# Patient Record
Sex: Male | Born: 1935 | Race: White | Hispanic: No | Marital: Married | Smoking: Former smoker
Health system: Southern US, Community
[De-identification: ages and names within clinical notes are randomized; demographics above are authoritative.]

## PROBLEM LIST (undated history)

## (undated) DIAGNOSIS — E785 Hyperlipidemia, unspecified: Secondary | ICD-10-CM

## (undated) DIAGNOSIS — R51 Headache: Secondary | ICD-10-CM

## (undated) DIAGNOSIS — E119 Type 2 diabetes mellitus without complications: Secondary | ICD-10-CM

## (undated) DIAGNOSIS — F0390 Unspecified dementia without behavioral disturbance: Secondary | ICD-10-CM

## (undated) DIAGNOSIS — F329 Major depressive disorder, single episode, unspecified: Secondary | ICD-10-CM

## (undated) DIAGNOSIS — I2510 Atherosclerotic heart disease of native coronary artery without angina pectoris: Secondary | ICD-10-CM

## (undated) DIAGNOSIS — R519 Headache, unspecified: Secondary | ICD-10-CM

## (undated) DIAGNOSIS — F32A Depression, unspecified: Secondary | ICD-10-CM

## (undated) HISTORY — DX: Headache: R51

## (undated) HISTORY — DX: Atherosclerotic heart disease of native coronary artery without angina pectoris: I25.10

## (undated) HISTORY — PX: CORONARY STENT PLACEMENT: SHX1402

## (undated) HISTORY — DX: Hyperlipidemia, unspecified: E78.5

## (undated) HISTORY — DX: Major depressive disorder, single episode, unspecified: F32.9

## (undated) HISTORY — DX: Unspecified dementia, unspecified severity, without behavioral disturbance, psychotic disturbance, mood disturbance, and anxiety: F03.90

## (undated) HISTORY — DX: Type 2 diabetes mellitus without complications: E11.9

## (undated) HISTORY — DX: Headache, unspecified: R51.9

## (undated) HISTORY — PX: INGUINAL HERNIA REPAIR: SHX194

## (undated) HISTORY — DX: Depression, unspecified: F32.A

## (undated) HISTORY — PX: OTHER SURGICAL HISTORY: SHX169

---

## 2004-09-23 ENCOUNTER — Other Ambulatory Visit: Payer: Self-pay

## 2004-09-23 ENCOUNTER — Inpatient Hospital Stay: Payer: Self-pay | Admitting: Cardiology

## 2004-09-28 ENCOUNTER — Inpatient Hospital Stay: Payer: Self-pay | Admitting: Unknown Physician Specialty

## 2008-02-03 ENCOUNTER — Ambulatory Visit: Payer: Self-pay | Admitting: Internal Medicine

## 2008-02-10 ENCOUNTER — Ambulatory Visit: Payer: Self-pay | Admitting: Internal Medicine

## 2008-02-17 ENCOUNTER — Ambulatory Visit: Payer: Self-pay | Admitting: Internal Medicine

## 2009-06-05 IMAGING — CT CT ABD-PELV W/ CM
1 of 2 series · 15 of 32 positions shown, 19 images · non-contrast
Comparison: none

REASON FOR EXAM: left lower abd and flank pain
COMMENTS:

PROCEDURE:     JUELZ - JUELZ ABDOMEN / PELVIS W  - February 03, 2008  [DATE]
RESULT:     Comparison: No available comparison exam.
TECHNIQUE: CT examination of the abdomen and pelvis was performed after
intravenous administration of 75 ml of Wsovue-T4J nonionic contrast in
addition to oral contrast. Collimation is 8 mm.

[Series 2: soft tissue · axial · 0.76mm/px · z∈[-452,-52]mm · 15 of 56 slices shown, 19 images]
[im 3/56  soft-tissue]
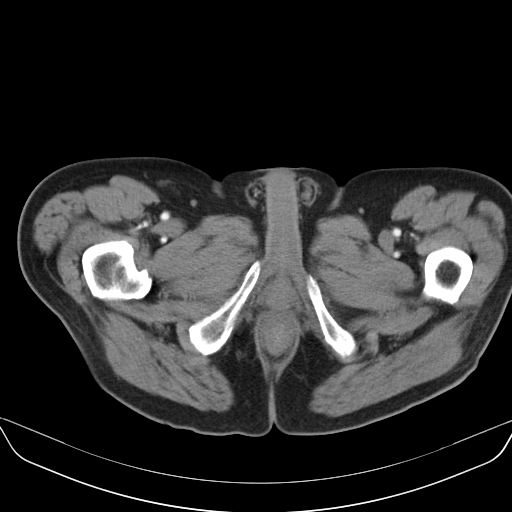
[im 3/56  bone]
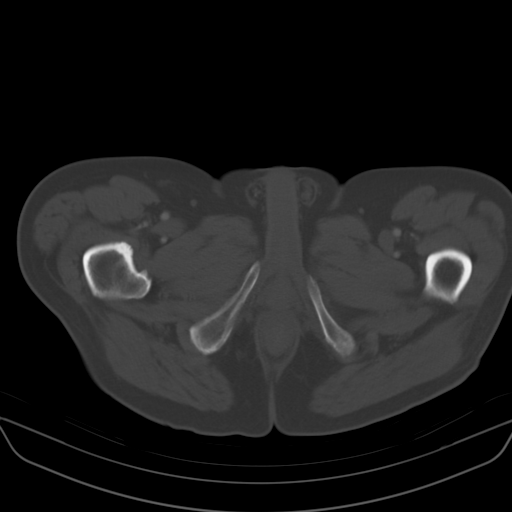
[im 8/56  soft-tissue]
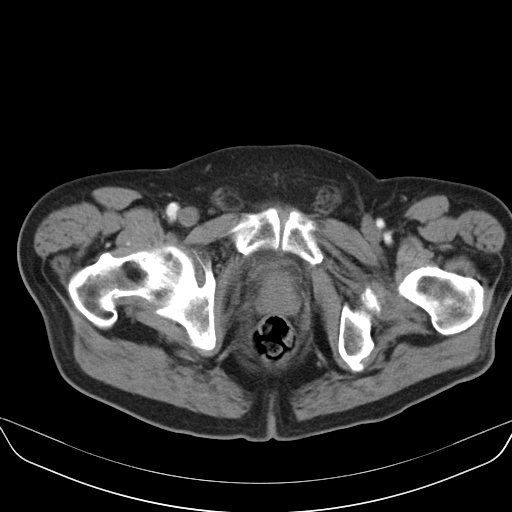
[im 13/56  soft-tissue]
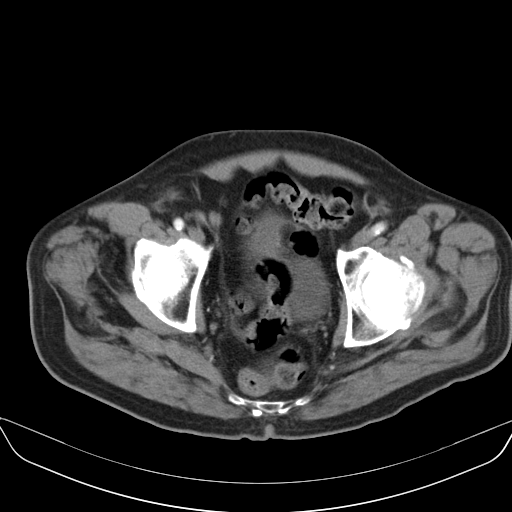
[im 16/56  soft-tissue]
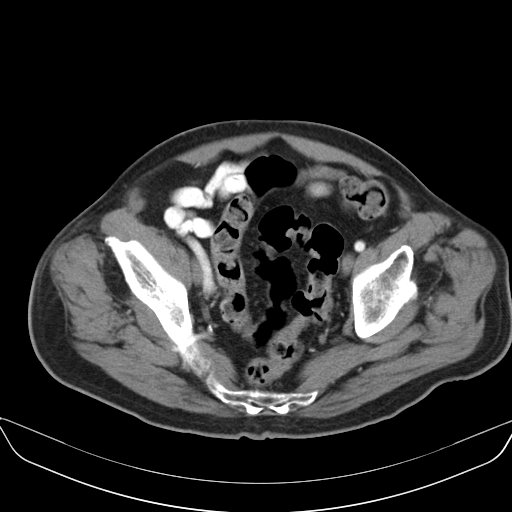
[im 21/56  soft-tissue]
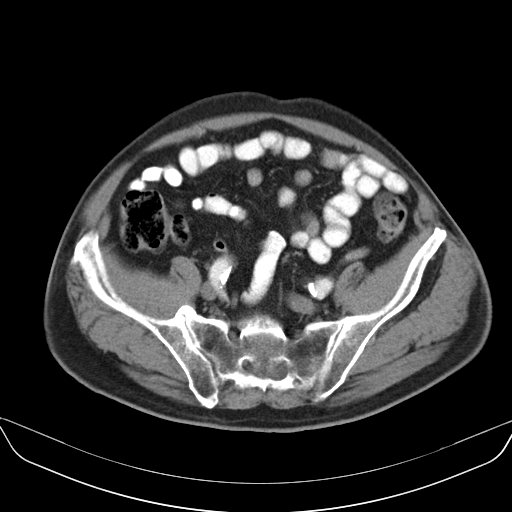
[im 23/56  soft-tissue]
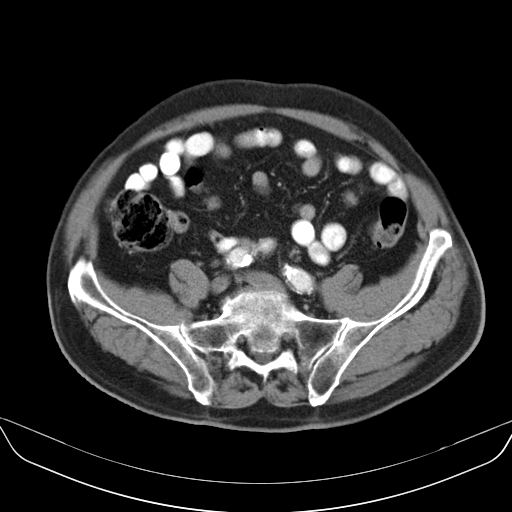
[im 28/56  soft-tissue]
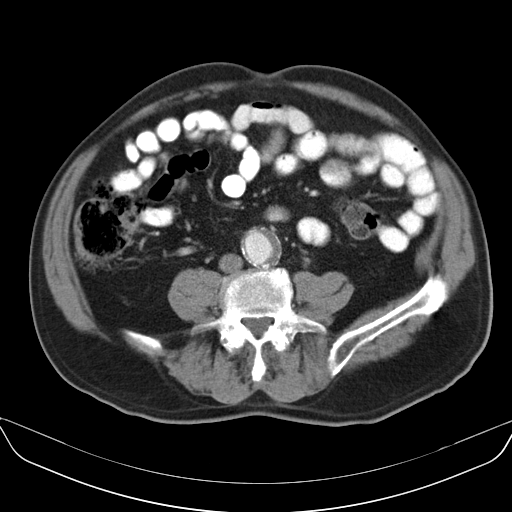
[im 33/56  soft-tissue]
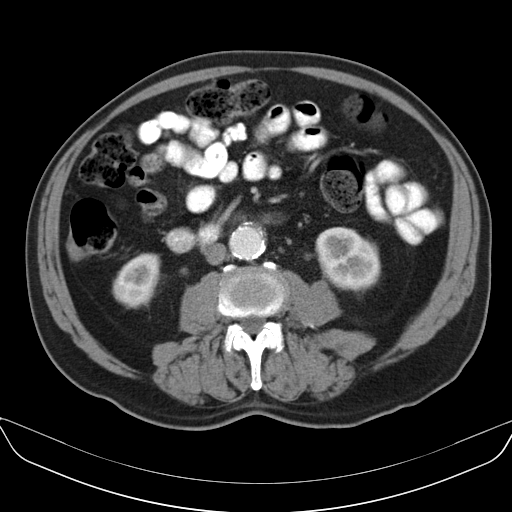
[im 36/56  soft-tissue]
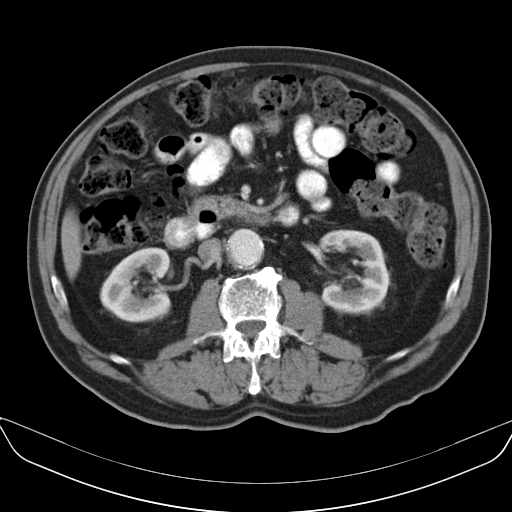
[im 36/56  bone]
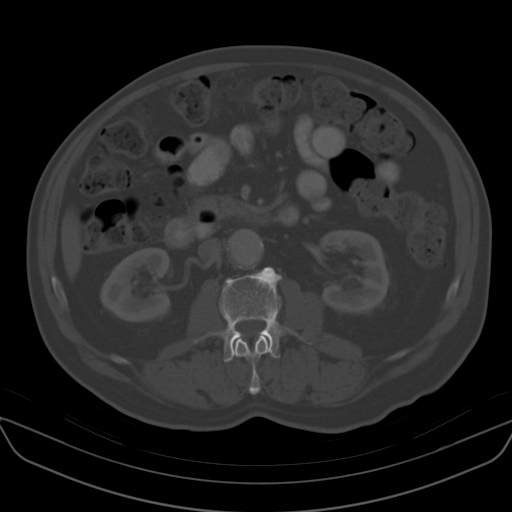
[im 41/56  soft-tissue]
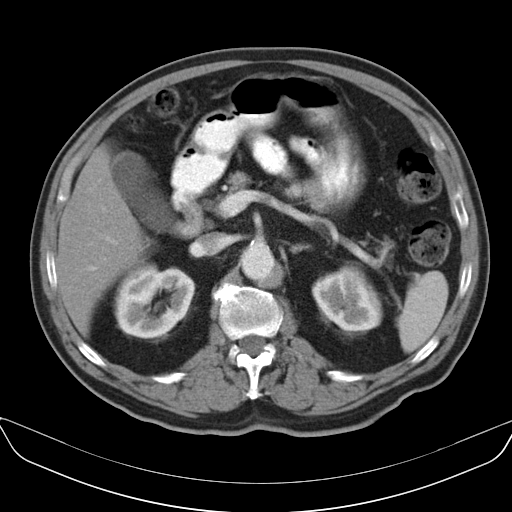
[im 43/56  soft-tissue]
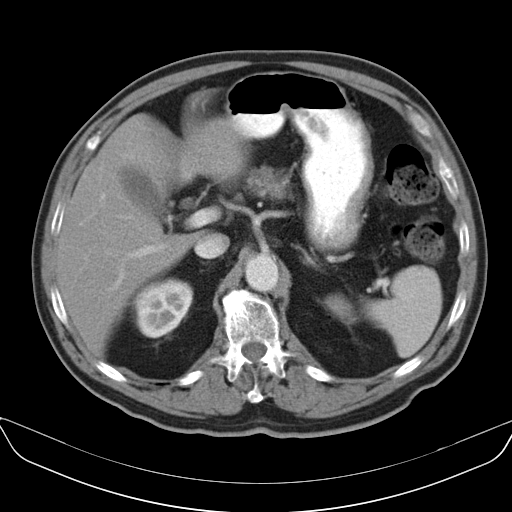
[im 46/56  lung]
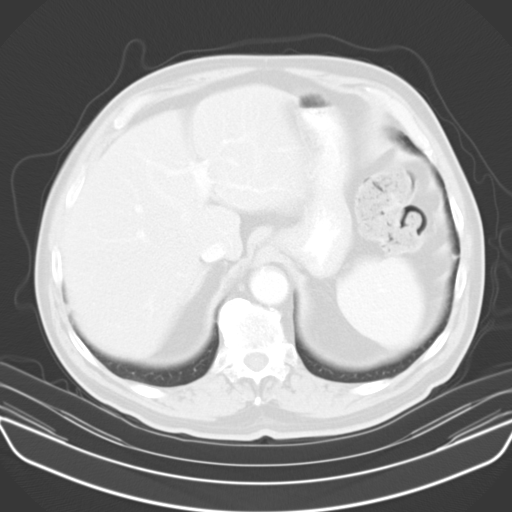
[im 48/56  soft-tissue]
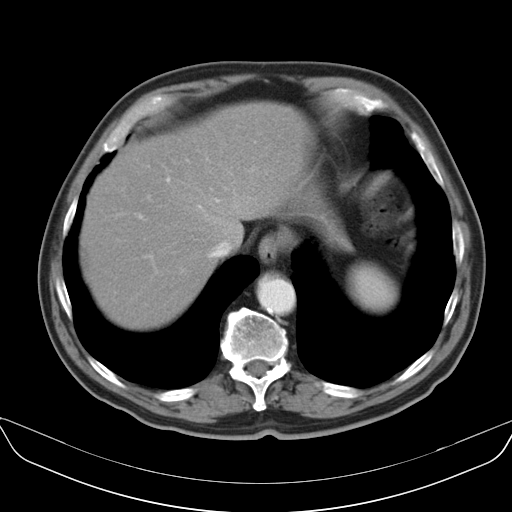
[im 48/56  lung]
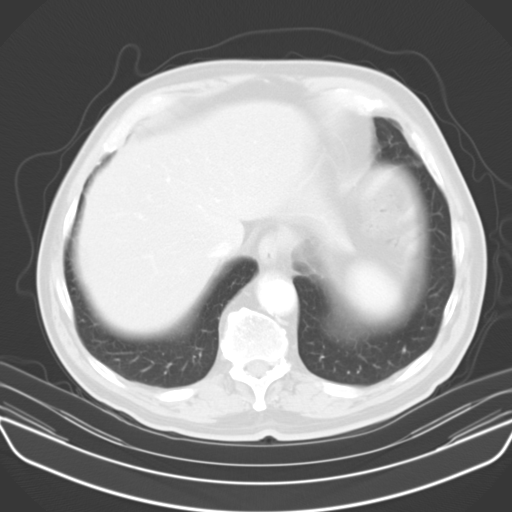
[im 51/56  lung]
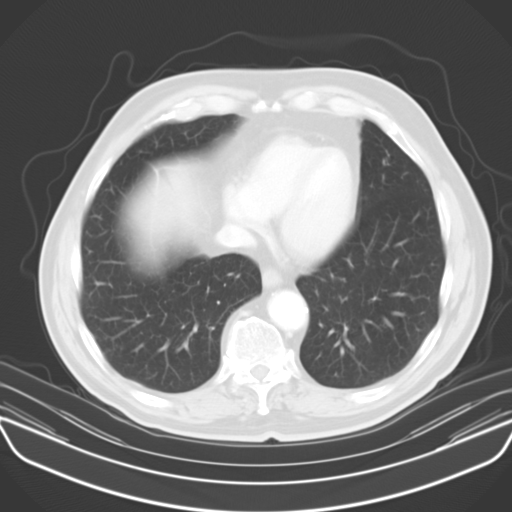
[im 53/56  soft-tissue]
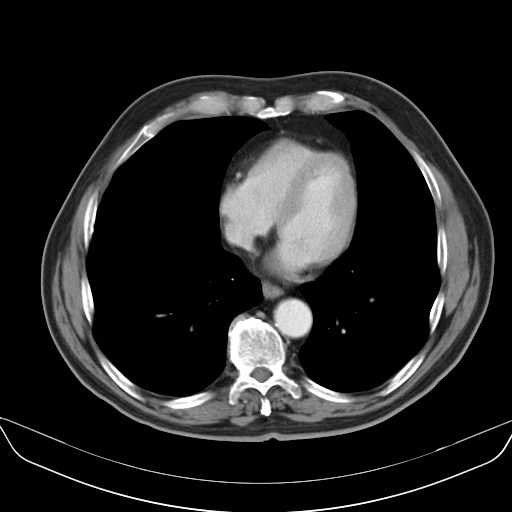
[im 53/56  lung]
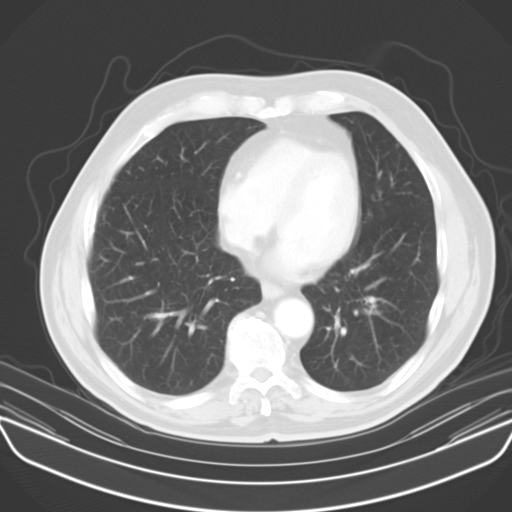

[15 of 32 positions shown; findings below may reference images not displayed]

FINDINGS: Several indeterminate small nodular opacities are seen involving the
bilateral lung bases. The largest in the left lower lobe measures 6 mm.

The liver, gallbladder, spleen, pancreas, adrenal glands and kidneys are
unremarkable. No renal or ureteral stone.

There is no dilatation or definite wall thickening of the bowels. The
appendix is not definitely seen. There is no significant intra
abdominal/pelvic fat stranding. There is no intraperitoneal free air. There
is no significant free fluid. There are no enlarged abdominal pelvic lymph
nodes.
IMPRESSION: 1. No abnormal finding to explain patient's left abdominal/flank pain.

2. Several indeterminate small nodular opacities are seen involving the
bilateral lung bases. The largest in the left lower lobe measures 6 mm.
Recommend follow up chest CT in 6 months.

## 2010-02-11 ENCOUNTER — Emergency Department: Payer: Self-pay | Admitting: Internal Medicine

## 2010-02-12 ENCOUNTER — Inpatient Hospital Stay: Payer: Self-pay | Admitting: Internal Medicine

## 2010-07-14 ENCOUNTER — Ambulatory Visit: Payer: Self-pay | Admitting: Internal Medicine

## 2010-07-16 ENCOUNTER — Inpatient Hospital Stay: Payer: Self-pay | Admitting: Internal Medicine

## 2010-10-10 ENCOUNTER — Ambulatory Visit: Payer: Self-pay | Admitting: Internal Medicine

## 2010-10-14 ENCOUNTER — Ambulatory Visit: Payer: Self-pay | Admitting: Internal Medicine

## 2011-01-09 ENCOUNTER — Ambulatory Visit: Payer: Self-pay | Admitting: Internal Medicine

## 2011-11-16 IMAGING — RF LUMBAR PUNCTURE FLUORO GUIDE
1 series · 1 of 1 positions shown · non-contrast
Comparison: none

REASON FOR EXAM: encephalopathy and fever
COMMENTS:

[Series 1: run · 1 of 1 slices shown]
[im 1/1]
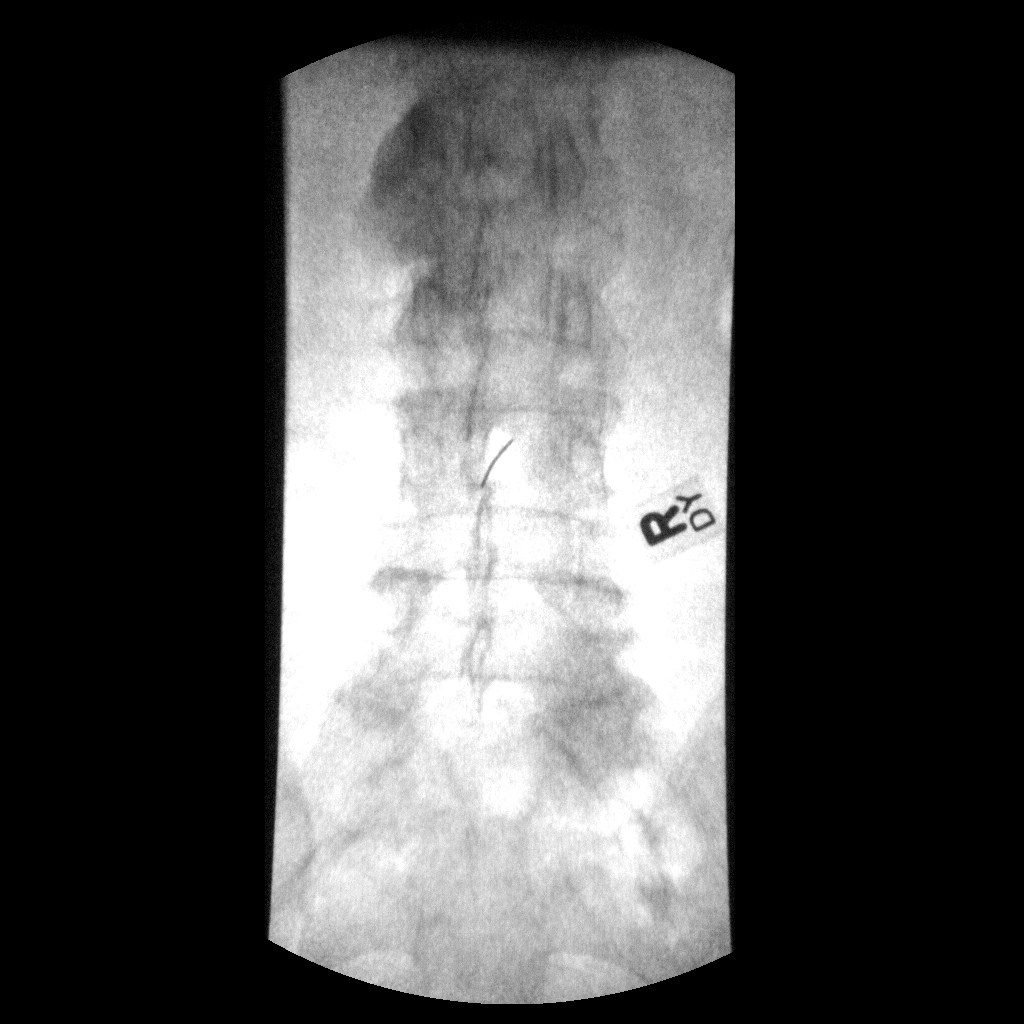

[1 of 1 positions shown; findings below may reference images not displayed]

PROCEDURE:     FL  - FL  GUIDED LUMBAR PUNCTURE  - July 16, 2010  [DATE]

RESULT:     Comparison: No prior studies for comparison.

Clinical Indication: Encephalopathy

Technique/findings: The procedure, risks, and benefits were discussed with
the patient and written informed consent was obtained. The patient was
placed in the prone position and prepped and draped in sterile fashion. With
the patient slightly oblique, under fluoroscopic guidance, the L3-L4
interspace was identified, to the right of midline. 1% lidocaine was used
for local anesthetic. Subsequently, a 22 gauge spinal needle was advanced
under fluoroscopic guidance to reach the thecal sac. Approximately 9 ml of
clear CSF was obtained, and divided into 4 tubes. The spinal needle was then
withdrawn, and pressure applied. Patient tolerated procedure well without
immediate complication.

Patient was sent to the EMG suite and instructed to lie supine for
approximately 2 hours. The nursing staff has been instructed to call should
there be any side effects, including severe post lumbar puncture headache.

CSF samples were sent to the laboratory for the requested laboratory studies.
IMPRESSION: Successful lumbar puncture under fluoroscopic guidance.

## 2011-11-16 IMAGING — CT CT HEAD WITHOUT CONTRAST
2 series · 15 of 30 positions shown, 19 images · non-contrast
Comparison: none

REASON FOR EXAM: AMS
COMMENTS:   May transport without cardiac monitor

PROCEDURE:     CT  - CT HEAD WITHOUT CONTRAST  - July 16, 2010 [DATE]
RESULT:     Head CT dated 07/16/2010 comparison made to prior study dated
02/12/2010.
TECHNIQUE: Helical noncontrasted 5 mm sections were obtained from the skull
base through the vertex.

[Series 2: without · axial · non-contrast · 0.47mm/px · z∈[+296,+432]mm · 13 of 33 slices shown, 17 images]
[im 3/33  brain]
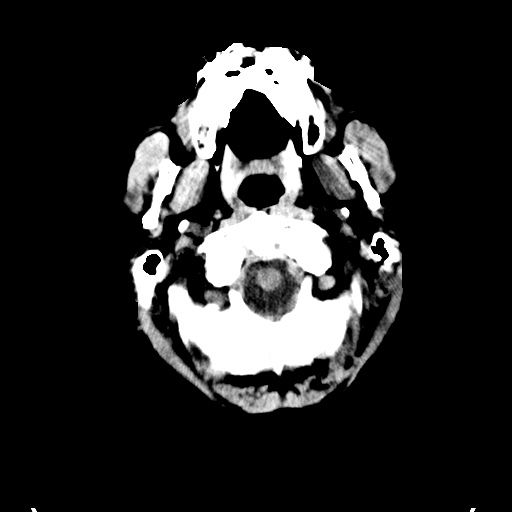
[im 3/33  bone]
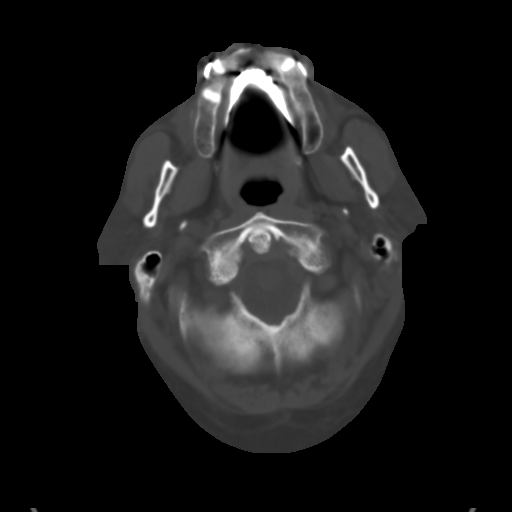
[im 5/33  brain]
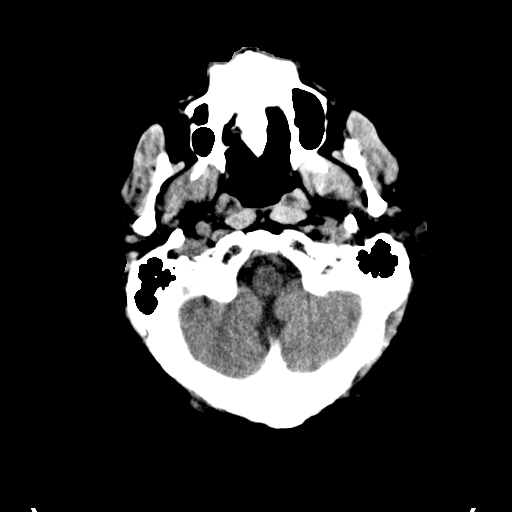
[im 7/33  brain]
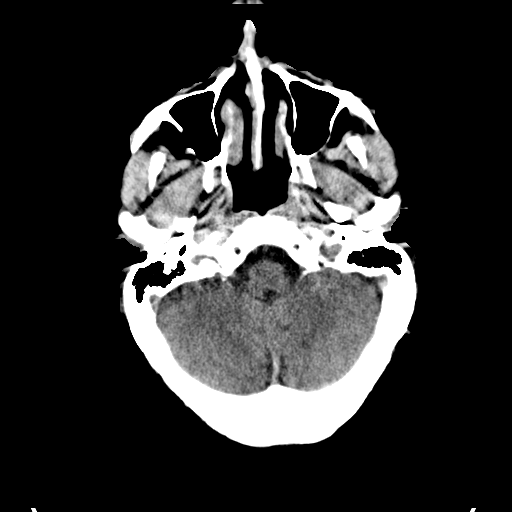
[im 10/33  brain]
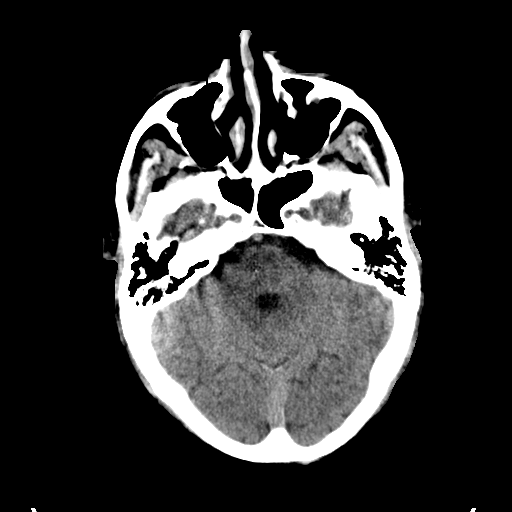
[im 12/33  brain]
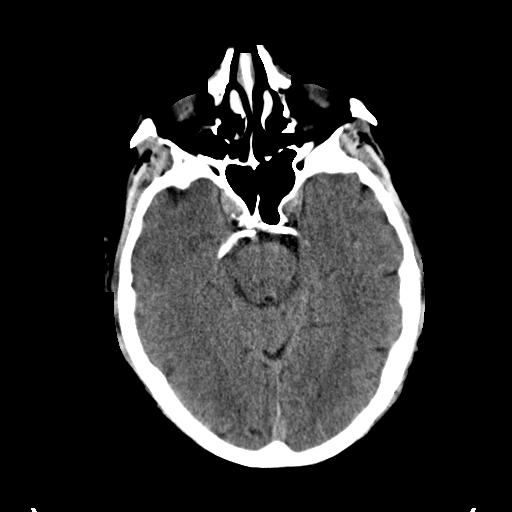
[im 12/33  bone]
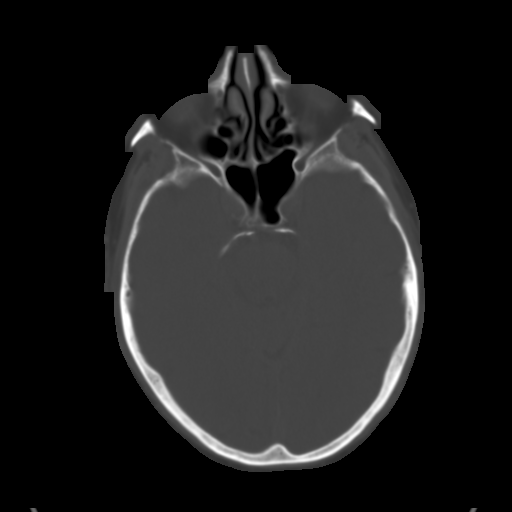
[im 14/33  brain]
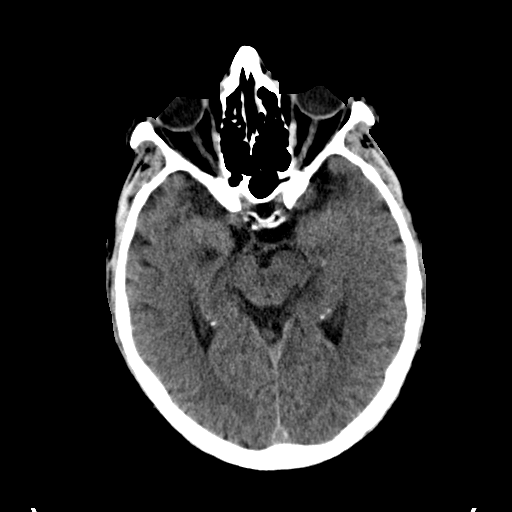
[im 17/33  brain]
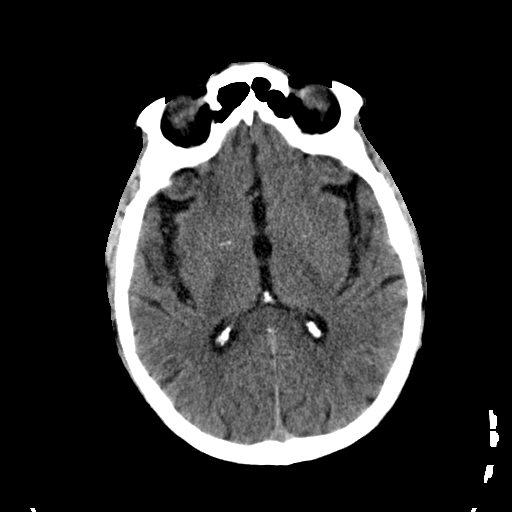
[im 19/33  brain]
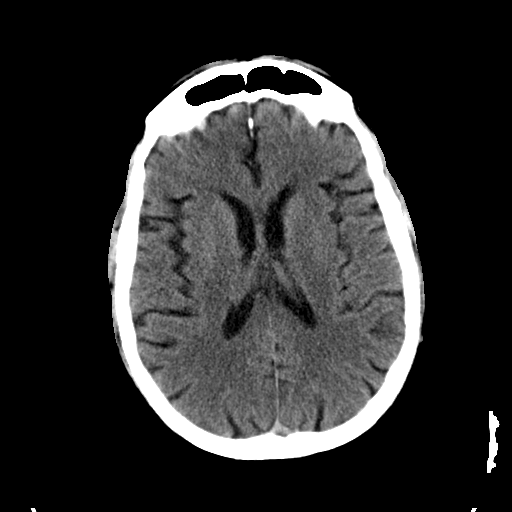
[im 21/33  brain]
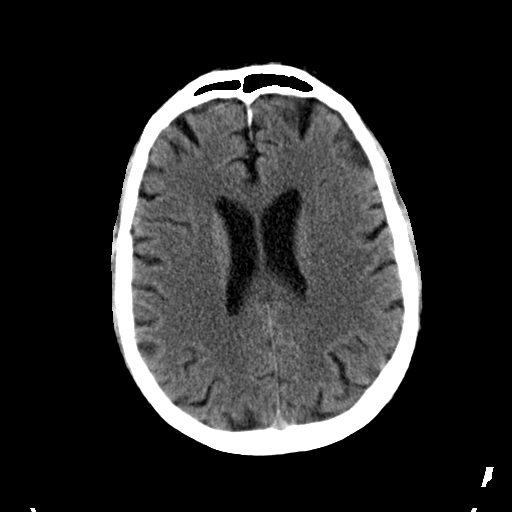
[im 21/33  bone]
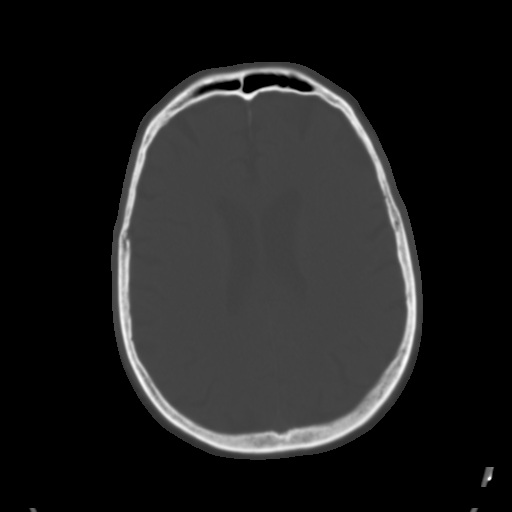
[im 23/33  brain]
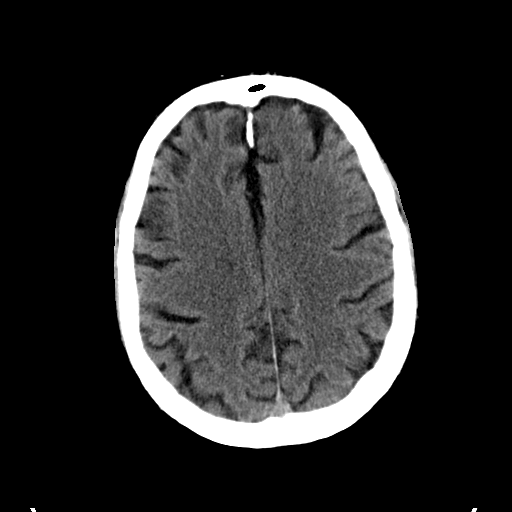
[im 26/33  brain]
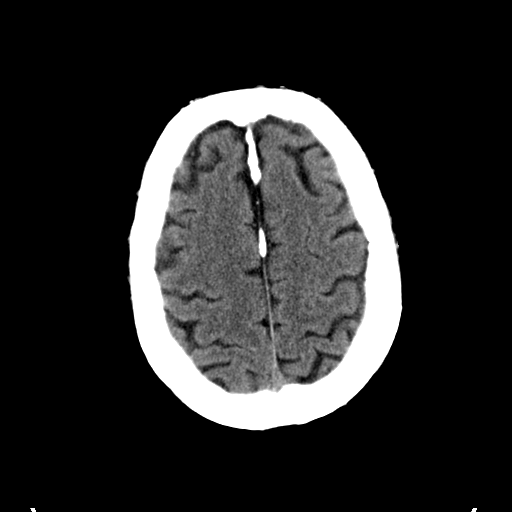
[im 28/33  brain]
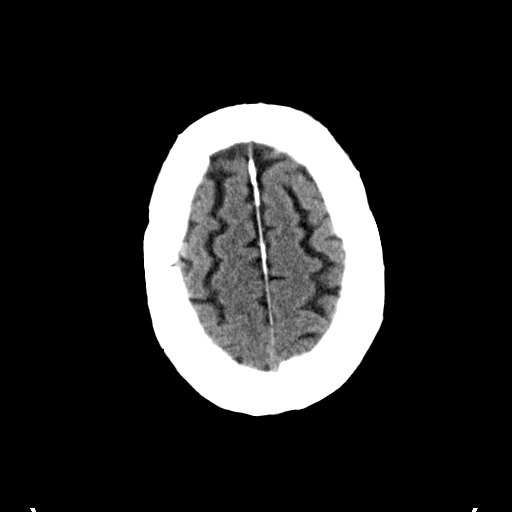
[im 30/33  brain]
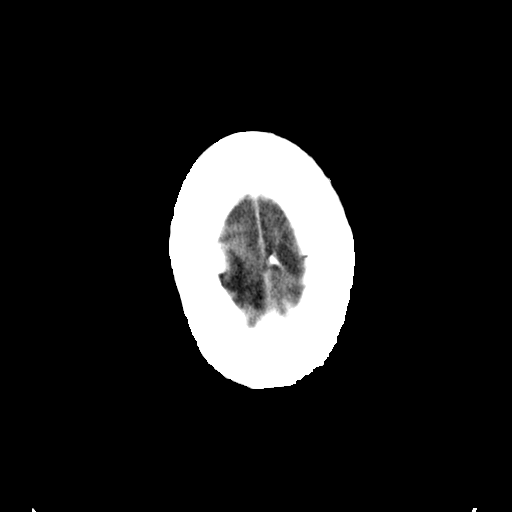
[im 30/33  bone]
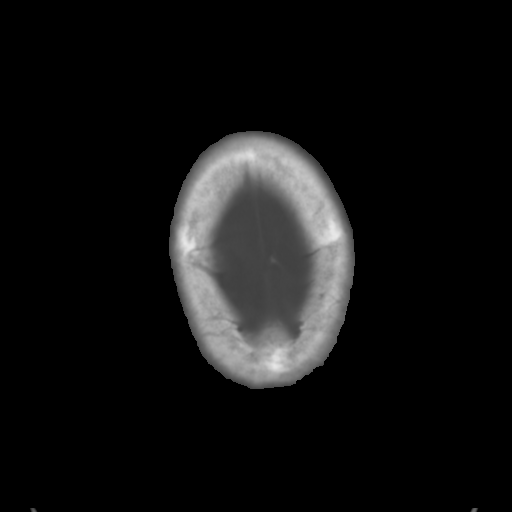

[Series 3: bone · axial · 0.47mm/px · z∈[+296,+316]mm · 2 of 33 slices shown]
[im 3/33  bone]
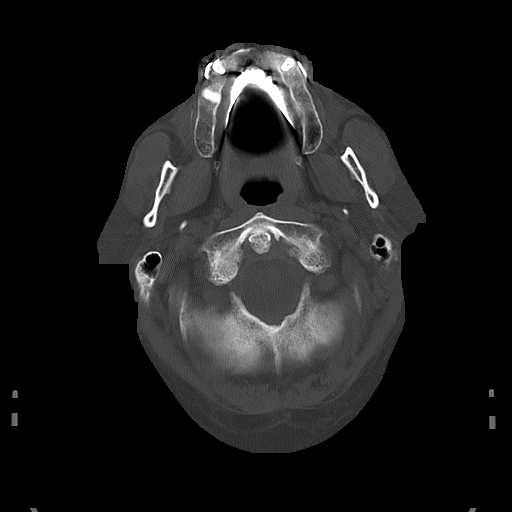
[im 7/33  bone]
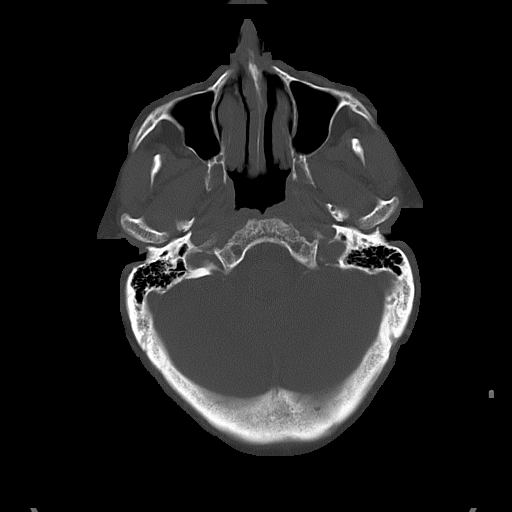

[15 of 30 positions shown; findings below may reference images not displayed]

FINDINGS: There is mild to moderate diffuse cortical atrophy. Mild areas of
low attenuation project within the subcortical, deep, and periventricular
right matter regions. There is no evidence of intra-axial nor extra-axial
fluid collections nor mass effect or acute hemorrhage. Evaluation of the
pons demonstrates an area of low-attenuation within the base of the on the
right. This may represent the sequela of prior infarction. The cerebellum is
grossly unremarkable. Basal ganglial calcifications are identified. There is
no evidence of a depressed cranial fracture.
IMPRESSION: Mild to moderate involutional changes as described above
2. Findings suspicious for a region of prior lacunar infarction involving
the pons
3. No evidence of acute abnormalities.

## 2013-06-21 ENCOUNTER — Encounter: Payer: Self-pay | Admitting: Neurology

## 2013-06-21 ENCOUNTER — Ambulatory Visit (INDEPENDENT_AMBULATORY_CARE_PROVIDER_SITE_OTHER): Payer: Medicare Other | Admitting: Neurology

## 2013-06-21 VITALS — BP 112/60 | HR 80 | Temp 98.0°F | Resp 16 | Wt 167.7 lb

## 2013-06-21 DIAGNOSIS — F329 Major depressive disorder, single episode, unspecified: Secondary | ICD-10-CM

## 2013-06-21 DIAGNOSIS — G219 Secondary parkinsonism, unspecified: Secondary | ICD-10-CM

## 2013-06-21 DIAGNOSIS — F0390 Unspecified dementia without behavioral disturbance: Secondary | ICD-10-CM

## 2013-06-21 DIAGNOSIS — G212 Secondary parkinsonism due to other external agents: Secondary | ICD-10-CM

## 2013-06-21 NOTE — Progress Notes (Signed)
Andres Fletcher was seen today in the movement disorders clinic for neurologic consultation at the request of Dr. Malvin Johns.  His psychiatrist is Dr. Lynann Beaver and his PCP is Elmo Putt, MD.  The consultation is for the evaluation of tremor and possible Parkinsons disease.  I reviewed Dr. Margaretmary Eddy and Dr. Daisy Blossom notes.  The patient is accompanied by his wife and daughter, both of whom help supplement the history.  The first symptom(s) the patient noticed was bilateral UEand shuffling and this was 10-12 years ago.  He was first dx with PD and later the dx was retracted as they thought that it was secondary to abilify.  He was then "rediagnosed" with PD 5 years ago, and was put on levodopa.  He is currently on abilify and has been on that medication for years but his physicians felt that this was not cause of his sx's as the sx's continued to progress despite the fact that he has not changed abilify dose.  Neither he nor his wife or daughter think that the levodopa has necessarily help.  Specific Symptoms:  Tremor: yes Voice: no changes Sleep: intermittent trouble with sleeping - wakes early AM and will just pace and walk  Vivid Dreams:  yes  Acting out dreams:  yes, talks and intermittently snores Wet Pillows: yes Postural symptoms:  yes  Falls?  yes (2-3 falls in last few months, no hx of fx, no ambulatory assistive device) Bradykinesia symptoms: shuffling gait, slow movements, slowed thought processes, difficulty getting out of a chair and difficulty regaining balance Loss of smell:  no Loss of taste:  no Urinary Incontinence:  yes (does not need to wear depends but should per wife) Difficulty Swallowing:  yes (occasionally per wife) Handwriting, micrographia: yes Trouble with ADL's:  yes  Trouble buttoning clothing: yes Depression:  yes but more angry than in the past; talked with psychiatry about this.  Has been hospitalized for this in the past, with ECT. Memory changes:  yes, has been told  not to drive but still does. Hallucinations:  no  visual distortions: no N/V:  no Lightheaded:  no  Syncope: no Diplopia:  no Dyskinesia:  no    PREVIOUS MEDICATIONS: Sinemet and Sinemet CR  ALLERGIES:   Allergies  Allergen Reactions  . Penicillins     CURRENT MEDICATIONS:  No current outpatient prescriptions on file prior to visit.   No current facility-administered medications on file prior to visit.    PAST MEDICAL HISTORY:   Past Medical History  Diagnosis Date  . Diabetes   . Hyperlipidemia   . Head ache   . Coronary artery disease   . Dementia   . Depression     PAST SURGICAL HISTORY:   Past Surgical History  Procedure Laterality Date  . Inguinal hernia repair Right   . Coronary stent placement    . Appendectomy      SOCIAL HISTORY:   History   Social History  . Marital Status: Married    Spouse Name: N/A    Number of Children: N/A  . Years of Education: N/A   Occupational History  . supervisor      industries   Social History Main Topics  . Smoking status: Former Smoker -- 1.00 packs/day    Types: Cigarettes    Quit date: 06/21/1988  . Smokeless tobacco: Never Used  . Alcohol Use: No     Comment: hx of heavy EtOH use but quit 25 years ago  . Drug  Use: No  . Sexual Activity: Not on file   Other Topics Concern  . Not on file   Social History Narrative  . No narrative on file    FAMILY HISTORY:   Family Status  Relation Status Death Age  . Father Deceased 14    Cancer, colon  . Mother Deceased 12    unknown cause of death  . Sister Deceased     3, leukemia, AD, asthma  . Sister Alive     4, CAD  . Brother Alive     2, one with DM; CAD  . Child Alive     2, one with COPD    ROS:  A complete 10 system review of systems was obtained and was unremarkable apart from what is mentioned above.  PHYSICAL EXAMINATION:    VITALS:   Filed Vitals:   06/21/13 1242  BP: 112/60  Pulse: 80  Temp: 98 F (36.7 C)  Resp:  16  Height: 5\' 6"  (1.676 m)  Weight: 167 lb 11.2 oz (76.068 kg)    GEN:  The patient appears stated age and is in NAD. HEENT:  Normocephalic, atraumatic.  The mucous membranes are moist. The superficial temporal arteries are without ropiness or tenderness. CV:  RRR Lungs:  CTAB Neck/HEME:  There are no carotid bruits bilaterally.  Neurological examination:  Orientation: The patient and his family supply most of the history.  He is very apraxic with motor commands. Cranial nerves: There is good facial symmetry. Pupils are equal round and reactive to light bilaterally. Fundoscopic exam reveals clear margins bilaterally. Extraocular muscles are intact. The visual fields are full to confrontational testing. The speech is fluent and clear. Soft palate rises symmetrically and there is no tongue deviation. Hearing is intact to conversational tone. Sensation: Sensation is intact to light and pinprick throughout (facial, trunk, extremities). Vibration is intact at the bilateral big toe. There is no extinction with double simultaneous stimulation. There is no sensory dermatomal level identified. Motor: Strength is 5/5 in the bilateral upper and lower extremities.   Shoulder shrug is equal and symmetric.  There is no pronator drift. Deep tendon reflexes: Deep tendon reflexes are 2/4 at the bilateral biceps, triceps, brachioradialis, patella and absent at the bilateral achilles. Plantar responses are downgoing bilaterally.  Movement examination: Tone: There is mildly increased tone in the bilateral upper extremities, most notable with activation procedures.  The pt scored a 22/30 on his MMSE.   Abnormal movements: There is a near constant her right upper extremity resting tremor and a more intermittent left upper extremity resting tremor. Coordination:  There is very mild decremation with RAM's, seen most prominently with finger taps bilaterally.  Hand opening and closing and heel taps and toe taps were  fairly good. Gait and Station: The patient has minimal difficulty arising out of a deep-seated chair without the use of the hands. The patient's stride length is slightly decreased.  There is decreased arm swing and an increase in the right upper extremity tremor with ambulation.    ASSESSMENT/PLAN:  1.  Parkinsonism.  -I had a long discussion with the patient and family.  I talked to them about the fact that Abilify can produce secondary parkinsonism, and even though the dosage has not changed over the years, the symptoms of parkinsonism could continue to progress because of long-term dopamine receptor blocking.  I do not know if this is idiopathic Parkinson's disease because of the fact that he is on Abilify.  In  order to definitively he will either need to change the medication to something like Seroquel or Clozaril, or have a DaT scan.  Ultimately, he and his family decided that he would like to talk to his psychiatrist.  I wrote a letter to his psychiatrist and I gave a copy to the patient to take with him and give it to the psychiatrist, and I will also send a copy to Dr. Alycia Rossetti.  -If it is possible to change the Abilify to Seroquel or Clozaril, then it will still take at least 6 months to know whether or not this represents idiopathic Parkinson's disease or secondary parkinsonism.  Tardive parkinsonism is quite rare.  -I. talked with him about Myobloc.  He does not think that the drooling is bad enough to warrant this currently.  -I. talked to him about a modified barium swallow evaluation since he is noticing some difficulty swallowing (more noticed by his family than him).  He is going to talk about this with Dr. Malvin Johns more.  -The physical therapy program for PD could be of value, but the patient has refused this. 2.  Dementia  `-I had a long discussion with the patient and his family.  Greater than 50% of the 60 minute visit was spent in counseling.  We talked about safety.  We talked about the  fact he should not be driving.  I do not think that he should be mowing his lawn, which he currently is.  I talked to his family about their responsibility and their role in helping the patient understand and know the things that he can do versus those he cannot. 3.  He is going to followup with Dr. Malvin Johns.  I would be happy to see him in the future if needed.  He is going to followup with his psychiatrist, Dr. Alycia Rossetti as well as his recommendations will be integral to our dx and treatment.  Thank you, Dr. Malvin Johns, for allowing me to see this patient and his family.  Please feel free to contact me should there be any questions or concerns.  -I. agree with the recent addition of Aricept.

## 2013-06-21 NOTE — Patient Instructions (Addendum)
1.  I recommend that you don't drive.  I recommend that you don't work with dangerous equipment, including the lawn equipment  2.  You may follow up with myself or Dr. Malvin Johns.

## 2013-10-15 ENCOUNTER — Ambulatory Visit: Payer: Self-pay | Admitting: Internal Medicine

## 2014-02-01 ENCOUNTER — Observation Stay: Payer: Self-pay | Admitting: Internal Medicine

## 2014-02-01 LAB — CBC WITH DIFFERENTIAL/PLATELET
Basophil #: 0.1 10*3/uL (ref 0.0–0.1)
Basophil %: 0.9 %
EOS ABS: 0.4 10*3/uL (ref 0.0–0.7)
EOS PCT: 6.6 %
HCT: 32.3 % — AB (ref 40.0–52.0)
HGB: 10.5 g/dL — AB (ref 13.0–18.0)
LYMPHS ABS: 1.2 10*3/uL (ref 1.0–3.6)
Lymphocyte %: 21.9 %
MCH: 32.6 pg (ref 26.0–34.0)
MCHC: 32.5 g/dL (ref 32.0–36.0)
MCV: 100 fL (ref 80–100)
MONO ABS: 0.4 x10 3/mm (ref 0.2–1.0)
Monocyte %: 7.7 %
NEUTROS PCT: 62.9 %
Neutrophil #: 3.5 10*3/uL (ref 1.4–6.5)
Platelet: 128 10*3/uL — ABNORMAL LOW (ref 150–440)
RBC: 3.22 10*6/uL — AB (ref 4.40–5.90)
RDW: 15.7 % — AB (ref 11.5–14.5)
WBC: 5.6 10*3/uL (ref 3.8–10.6)

## 2014-02-01 LAB — COMPREHENSIVE METABOLIC PANEL
ALBUMIN: 3.6 g/dL (ref 3.4–5.0)
ALK PHOS: 86 U/L
Anion Gap: 4 — ABNORMAL LOW (ref 7–16)
BUN: 12 mg/dL (ref 7–18)
Bilirubin,Total: 0.5 mg/dL (ref 0.2–1.0)
CHLORIDE: 106 mmol/L (ref 98–107)
CREATININE: 0.76 mg/dL (ref 0.60–1.30)
Calcium, Total: 8.2 mg/dL — ABNORMAL LOW (ref 8.5–10.1)
Co2: 29 mmol/L (ref 21–32)
EGFR (Non-African Amer.): >60
Glucose: 146 mg/dL — ABNORMAL HIGH (ref 65–99)
OSMOLALITY: 280 (ref 275–301)
POTASSIUM: 3.5 mmol/L (ref 3.5–5.1)
SGOT(AST): 24 U/L (ref 15–37)
SGPT (ALT): 8 U/L — ABNORMAL LOW (ref 12–78)
Sodium: 139 mmol/L (ref 136–145)
Total Protein: 6.6 g/dL (ref 6.4–8.2)

## 2014-02-01 LAB — URINALYSIS, COMPLETE
BACTERIA: NONE SEEN
Bilirubin,UR: NEGATIVE
Blood: NEGATIVE
GLUCOSE, UR: NEGATIVE mg/dL (ref 0–75)
Leukocyte Esterase: NEGATIVE
Nitrite: NEGATIVE
PROTEIN: NEGATIVE
Ph: 5 (ref 4.5–8.0)
RBC,UR: <1 /HPF (ref 0–5)
SQUAMOUS EPITHELIAL: NONE SEEN
Specific Gravity: 1.042 (ref 1.003–1.030)

## 2014-02-01 LAB — CK TOTAL AND CKMB (NOT AT ARMC)
CK, TOTAL: 70 U/L
CK, TOTAL: 70 U/L
CK, TOTAL: 79 U/L
CK-MB: 1.7 ng/mL (ref 0.5–3.6)
CK-MB: 1.9 ng/mL (ref 0.5–3.6)
CK-MB: 2.1 ng/mL (ref 0.5–3.6)

## 2014-02-01 LAB — TROPONIN I
Troponin-I: <0.02 ng/mL
Troponin-I: <0.02 ng/mL
Troponin-I: <0.02 ng/mL

## 2014-02-01 LAB — LIPASE, BLOOD: LIPASE: 441 U/L — AB (ref 73–393)

## 2015-02-04 NOTE — H&P (Signed)
PATIENT NAME:  Andres Fletcher, GRANIERI MR#:  [id number] DATE OF BIRTH:  August 05, 1929  DATE OF ADMISSION:  02/01/2014  PRIMARY CARE PHYSICIAN: Dr. Yates Decamp  REFERRING MEDICAL DOCTOR: Dr.  Manson Passey.   CHIEF COMPLAINT: Chest pain.   HISTORY OF PRESENT ILLNESS: The patient is an 81 year old male with past medical history of coronary artery disease status post stent placement in the year 1990, hyperlipidemia, diabetes mellitus, chronic anemia, depression and hypertension is presenting to the ER with a chief complaint of chest pain. The patient lives alone and his caretaker takes care of him. The patient is a poor historian in view of Parkinson's disease and underlying dementia. Daughter and wife are not quite sure when the patient started having chest pain. The patient is a poor historian and keeps on changing his chief complaint. To the ER physician he has complained of chest pain with no shortness of breath. During my examination, the patient is complaining of epigastric abdominal discomfort. To a few ER staff the patient was complaining of nausea. By his reporting  the patient is a poor historian and we can not rely on his history. First set of cardiac enzymes are negative. EKG has revealed left bundle branch block. There are no previous EKGs to compare with. Hospitalist team is called to admit the patient. The patient sees Dr. Gwen Pounds, the cardiologist as an outpatient. CAT scan of the abdomen and pelvis was done as patient was complaining of abdominal pain, which has revealed stool in the colon. No other complaints.   PAST MEDICAL HISTORY: Coronary artery disease status post stent, diabetes mellitus, depression, hypertension, hyperlipidemia, chronic anemia, chronic depression, history of hypogonadism.  PAST SURGICAL HISTORY: Hernia repair, stent placement.   ALLERGIES: THE PATIENT IS ALLERGIC TO PENICILLIN AND STATINS.   PSYCHOSOCIAL HISTORY: Lives at home, lives alone, caretaker takes care of him. The patient  used to smoke, but quit smoking more than 10 years ago. Denies alcohol. No  history of illicit drugs.   FAMILY HISTORY: Father died from colon cancer. Mother deceased from congestive heart failure.   HOME MEDICATIONS: Prednisone 20 mg once daily, glipizide 5 mg once daily, Effexor 150 mg once a day, carbidopa levodopa 25/100, 1 tablet p.o. 3 times a day. Abilify 10 mg p.o. once daily.  REVIEW OF SYSTEMS: The patient being poor historian and demented, unable to get  review of systems.   PHYSICAL EXAMINATION: VITAL SIGNS: Temperature 98 degrees Fahrenheit, pulse 66, respirations 20, blood pressure 136/72,  pulse oximetry 98%.  GENERAL APPEARANCE: Not in acute distress. Moderately built and nourished.  HEENT: Normocephalic, atraumatic. Pupils are equally reacting to light and accommodation. No scleral icterus. No conjunctival injection. No sinus tenderness. No postnasal drip. Moist mucous membranes.  NECK: Supple. No JVD. No thyromegaly. Range of motion is intact.  LUNGS: Clear to auscultation bilaterally. No accessory muscle use and no anterior chest wall tenderness on palpation.  CARDIOVASCULAR: S1, S2 normal. Regular rate and rhythm. Positive murmur.  GASTROINTESTINAL: Soft. Bowel sounds are positive in all four quadrants, positive epigastric tenderness, but no rebound tenderness. No masses felt. No hepatosplenomegaly.  NEUROLOGIC:  The patient is oriented x 1 to 2, follows few verbal commands. Reflexes are 2+ spontaneously moving his extremities.  SKIN: Warm to touch. Normal turgor. No rashes. No lesions.  MUSCULOSKELETAL: No joint effusion, tenderness, erythema.  PSYCHIATRIC: Normal mood and affect.  EXTREMITIES: Peripheral pulses are 1+. No clubbing. No edema. No cyanosis.   LABORATORIES AND IMAGING STUDIES: Cardiac enzymes, CK total 70,  CPK-MB 1.7. Troponin less than 0.02. WBC 5.6, hemoglobin 10.5, hematocrit 32.3, platelets are 128. Urinalysis yellow in color, clear in appearance, blood  negative, nitrites and leukocyte esterase are negative. LFTs are normal , glucose 146, BUN, creatinine, sodium, potassium and chloride are normal. GFR is greater than 60. Anion gap 4, serum osmolality 280. Calcium 8.2, lipase is 441. CT chest, abdomen and pelvis, no acute cardiopulmonary process. Lung  examination with redemonstration of multiple pulmonary nodules. No convincing evidence of new pulmonary nodules. CT abdomen and pelvis moderate amount of retained large bowel stool without bowel obstruction, no acute intraabdominal or pelvic process. Stable 3.5 cm infrarenal aortic aneurysm in a background of calcific atherosclerosis. A 12-lead EKG has revealed left bundle branch block.    ASSESSMENT AND PLAN: An 81 year old male, who is a poor historian with history of Parkinson disease underling dementia complaining of chest pain, epigastric pain and nausea, will be admitted with the following assessment and plan.  1. Chest pain, rule out acute myocardial infarction. Admit him to telemetry. Cycle cardiac biomarkers, acute coronary syndrome protocol. Cardiology consult is placed to Dr. Gwen PoundsKowalski. EKG has revealed left bundle branch block. No old EKGs to compare with and we are not quite sure whether it is new or old, but initial set of cardiac enzymes were negative.  2. Pulmonary nodule. Primary care physician to follow up with repeat CAT scan of the chest in three months  .  3. Epigastric abdominal pain with nausea. The patient is not actively vomiting. In fact, the patient is complaining of abdominal discomfort in the epigastric area. We will provide him PPI provide pain management if needed.  4. Diabetes mellitus. Continue his home medication and the patient will be on sliding scale insulin.  5. Hypertension. Resume home medications and titrate blood pressure medications as needed basis.  6. Hyperlipidemia. Continue statin.  7. Parkinson's. Continue levodopa carbidopa.  8. Will provide gastrointestinal  and deep vein thrombosis prophylaxis.  9. CODE STATUS: He is full code. Daughter is the medical power of attorney. The patient will be transferred to Dr. Dan HumphreysWalker in the a.m. Plan of care was discussed in detail with the patient's wife and daughter at bedside. They both verbalized understanding of the plan.   TOTAL TIME SPENT ON ADMISSION: Is 50 minutes.   ____________________________ Ramonita LabAruna Captain Blucher, MD ag:sg D: 02/01/2014 08:05:55 ET T: 02/01/2014 08:44:28 ET JOB#: [id number]  cc: Ramonita LabAruna Pharoah Goggins, MD, <Dictator> John B. Danne HarborWalker III, MD  Ramonita LabARUNA Tien Spooner MD ELECTRONICALLY SIGNED 02/16/2014 4:07
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
## 2018-05-14 DEATH — deceased
